# Patient Record
Sex: Female | Born: 1960 | Race: White | Hispanic: No | Marital: Single
Health system: Southern US, Community
[De-identification: ages and names within clinical notes are randomized; demographics above are authoritative.]

---

## 2003-09-28 ENCOUNTER — Ambulatory Visit (HOSPITAL_COMMUNITY): Admission: RE | Admit: 2003-09-28 | Discharge: 2003-09-28 | Payer: Self-pay | Admitting: Family Medicine

## 2005-02-09 ENCOUNTER — Other Ambulatory Visit: Admission: RE | Admit: 2005-02-09 | Discharge: 2005-02-09 | Payer: Self-pay | Admitting: Family Medicine

## 2006-06-10 ENCOUNTER — Ambulatory Visit: Payer: Self-pay | Admitting: Pulmonary Disease

## 2006-07-20 ENCOUNTER — Ambulatory Visit (HOSPITAL_BASED_OUTPATIENT_CLINIC_OR_DEPARTMENT_OTHER): Admission: RE | Admit: 2006-07-20 | Discharge: 2006-07-20 | Payer: Self-pay | Admitting: Pulmonary Disease

## 2006-07-25 ENCOUNTER — Ambulatory Visit: Payer: Self-pay | Admitting: Pulmonary Disease

## 2006-08-09 ENCOUNTER — Ambulatory Visit: Payer: Self-pay | Admitting: Pulmonary Disease

## 2007-08-22 ENCOUNTER — Ambulatory Visit: Payer: Self-pay | Admitting: Pulmonary Disease

## 2011-02-02 ENCOUNTER — Other Ambulatory Visit (HOSPITAL_COMMUNITY)
Admission: RE | Admit: 2011-02-02 | Discharge: 2011-02-02 | Disposition: A | Discharge status: Home / Self Care | Payer: BC Managed Care – PPO | Source: Ambulatory Visit | Attending: Family Medicine | Admitting: Family Medicine

## 2011-02-02 ENCOUNTER — Other Ambulatory Visit: Payer: Self-pay | Admitting: Family Medicine

## 2011-02-02 DIAGNOSIS — Z0000 Encounter for general adult medical examination without abnormal findings: Secondary | ICD-10-CM | POA: Insufficient documentation

## 2011-04-03 NOTE — Procedures (Signed)
NAME:  Melissa Luna, Melissa Luna                 ACCOUNT NO.:  [id number]   MEDICAL RECORD NO.:  [id number]          PATIENT TYPE:  OUT   LOCATION:  SLEEP CENTER                 FACILITY:  [facility]   PHYSICIAN:  Barbaraann Share, MD,FCCPDATE OF BIRTH:  1960/10/07   DATE OF STUDY:  07/20/2006                              NOCTURNAL POLYSOMNOGRAM   REFERRING PHYSICIAN:  Dr. Marcelyn Bruins.   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   EPWORTH SCORE:  20   SLEEP ARCHITECTURE:  The patient had a total sleep time of 425 minutes with  decreased slow wave sleep but adequate REM.  Sleep onset latency was normal  as was REM onset.  Sleep efficiency was fairly good at 96%.   RESPIRATORY DATA:  The patient was found to have 3 hypopneas and 14 apneas  for a Respiratory Disturbance Index of 2.4 events per hour.  The events were  not positional but they occurred almost exclusively during REM.  There was  mild snoring noted throughout.   OXYGEN DATA:  There was O2 desaturation as low as 92% with the patient's  obstructive events.   CARDIAC DATA:  No clinically significant cardiac arrhythmia.   MOVEMENT/PARASOMNIA:  The patient was found to have 34 leg jerks with less  than one per hour resulting in arousal or awakening.   IMPRESSION/RECOMMENDATION:  1. Small numbers of obstructive events which do not meet the Respiratory      Disturbance Index criteria for the obstructive sleep apnea syndrome.      The patient did however have very large numbers of nonspecific arousals      as well as her events occurring almost exclusively during rapid eye      movement.  This raises a question regarding the upper airway resistant      syndrome.  2. Small numbers of leg jerks with very little sleep disruption.          ______________________________  Barbaraann Share, MD,FCCP  Diplomate, American Board of Sleep  Medicine     [facility]  D:  07/22/2006 14:22:50  T:  07/22/2006  19:45:42  Job:  [id number]

## 2011-04-03 NOTE — Assessment & Plan Note (Signed)
[facility]                               PULMONARY OFFICE NOTE   Melissa Luna, Melissa Luna                          MRN:          [id number]  DATE:06/10/2006                            DOB:          [birth year]    SLEEP MEDICINE CONSULTATION:   HISTORY OF PRESENT ILLNESS:  The patient is a very pleasant 50 year old  white female whom I have been asked to see for sleep maintenance issues.  The patient states that she has always been a light sleeper, but now she has  gotten to the point where she will awaken one to three times a night for  unknown reasons.  She typically gets to bed about 10 p.m. and gets up to  start her day at 5 a.m.  She has no difficulty falling asleep within  minutes, however usually awakens around 2 to 3 a.m.  She either cannot get  back to sleep or she dozes off and on so that her sleep is significantly  disrupted.  Usually she will fall asleep within 20-30 minutes but she will  awaken at least once or maybe two more times during the night.  Even on  nights that she gets back to sleep fairly quickly, she never feels like she  is in a deep sleep.  The patient has had occasional snoring but no one has  ever commented on pauses in her breathing during sleep.  She denies any  symptoms of restless legs syndrome, nor has anyone ever commented on a lot  of kicking during the night.  The patient does not have a sense of  frustration about sleeping; however, she is worried about her daytime  sleepiness.  She has significant issues in the late afternoon and evening  while at work and has even had some difficulties with driving.  She has lots  of fatigue during the day and has noticed decreased memory and  concentration.  She will often use an energy drink in the afternoons which  is very heavy in caffeine to help get her through her day and early evening.  The patient states that she is a morning person and usually has no  difficulties with  getting up and getting going first thing in the morning.  The problems usually develop later.  The patient denies any gastroesophageal  reflux symptoms.  In terms of caffeine intake, she has one cup of coffee in  the morning and then her energy drink as stated above in the midafternoon  only for the last 2-3 weeks.  Apparently she has had thyroid function tests  by Dr. Henderson Cloud which were within normal limits.   PAST MEDICAL HISTORY:  Totally unremarkable.  The patient takes testosterone  cream and other vitamins.  She has no known drug allergies.   SOCIAL HISTORY:  She is single and she has never smoked.   FAMILY HISTORY:  Significant for heart disease, otherwise noncontributory.   REVIEW OF SYSTEMS:  As per history of present illness.  Also see patient  intake form documented on the chart.   PHYSICAL EXAMINATION:  GENERAL:  She is a well-developed white female in no  acute distress.  VITAL SIGNS:  Blood pressure is 110/74, pulse is 64, temperature is 98.6.  weight is 140 pounds.  O2 saturation on room air is 99%.  HEENT:  Pupils equal, round and reactive to light and accommodation.  Extraocular muscles are intact.  Nares are somewhat narrowed but patent.  Oropharynx is fairly clear with no significant elongation of the soft palate  or the uvula.  NECK:  Supple without JVD or lymphadenopathy.  There is no palpable  thyromegaly.  CHEST:  Totally clear.  CARDIAC:  Regular rate and rhythm, no murmurs, rubs or gallops.  ABDOMEN:  Soft, nontender, with good bowel sounds.  GENITAL, RECTAL, BREASTS:  Not done and not indicated.  EXTREMITIES:  Lower extremities are without edema.  Good pulses distally.  No calf tenderness.  NEUROLOGIC:  Alert and oriented with no obvious verbal or motor defects.   IMPRESSION:  Sleep maintenance issues with frequent awakening during the  night and significant inappropriate daytime sleepiness with periods of  inactivity.  History really does not suggest  insomnia, especially since she  is usually back to sleep very easily within 20-30 minutes.  I really think  there is something disrupting her sleep that is not obvious from the history  and physical exam.  It was unclear whether she could have very  mild sleep-  disordered breathing, possibly periodic leg movements which may be  disrupting sleep, or possibly occult gastroesophageal reflux disease.  I  really do not think pursuing a course of treatment for insomnia is the right  answer here, although I do not know this for certainty.  I think that she  would benefit from nocturnal polysomnography to see if there is some  identifiable cause for her awakenings at night.  The patient is agreeable to  this approach.   PLAN:  1.  Schedule for nocturnal polysomnogram.  2.  Follow up after the above.                                   Barbaraann Share, MD, FCCP   [facility]  DD:  06/10/2006  DT:  06/10/2006  Job #:  [id number]   cc:   Guy Sandifer. Henderson Cloud, MD

## 2013-06-02 ENCOUNTER — Ambulatory Visit
Admission: RE | Admit: 2013-06-02 | Discharge: 2013-06-02 | Disposition: A | Discharge status: Home / Self Care | Payer: BC Managed Care – PPO | Source: Ambulatory Visit | Attending: Family Medicine | Admitting: Family Medicine

## 2013-06-02 ENCOUNTER — Other Ambulatory Visit: Payer: Self-pay | Admitting: Family Medicine

## 2013-06-02 DIAGNOSIS — M79672 Pain in left foot: Secondary | ICD-10-CM

## 2013-06-02 DIAGNOSIS — M79642 Pain in left hand: Secondary | ICD-10-CM

## 2013-06-02 DIAGNOSIS — M79671 Pain in right foot: Secondary | ICD-10-CM

## 2013-12-15 ENCOUNTER — Other Ambulatory Visit: Payer: Self-pay

## 2013-12-15 DIAGNOSIS — Z1231 Encounter for screening mammogram for malignant neoplasm of breast: Secondary | ICD-10-CM

## 2014-01-08 ENCOUNTER — Ambulatory Visit
Admission: RE | Admit: 2014-01-08 | Discharge: 2014-01-08 | Disposition: A | Discharge status: Home / Self Care | Payer: BC Managed Care – PPO | Source: Ambulatory Visit

## 2014-01-08 DIAGNOSIS — Z1231 Encounter for screening mammogram for malignant neoplasm of breast: Secondary | ICD-10-CM

## 2015-02-15 IMAGING — CR DG FOOT 2V*L*
2 series · 2 of 2 positions shown · non-contrast
Comparison: None

CLINICAL DATA: Bilateral foot pain

LEFT FOOT - 2 VIEW

[view not recorded (1 of 2)]
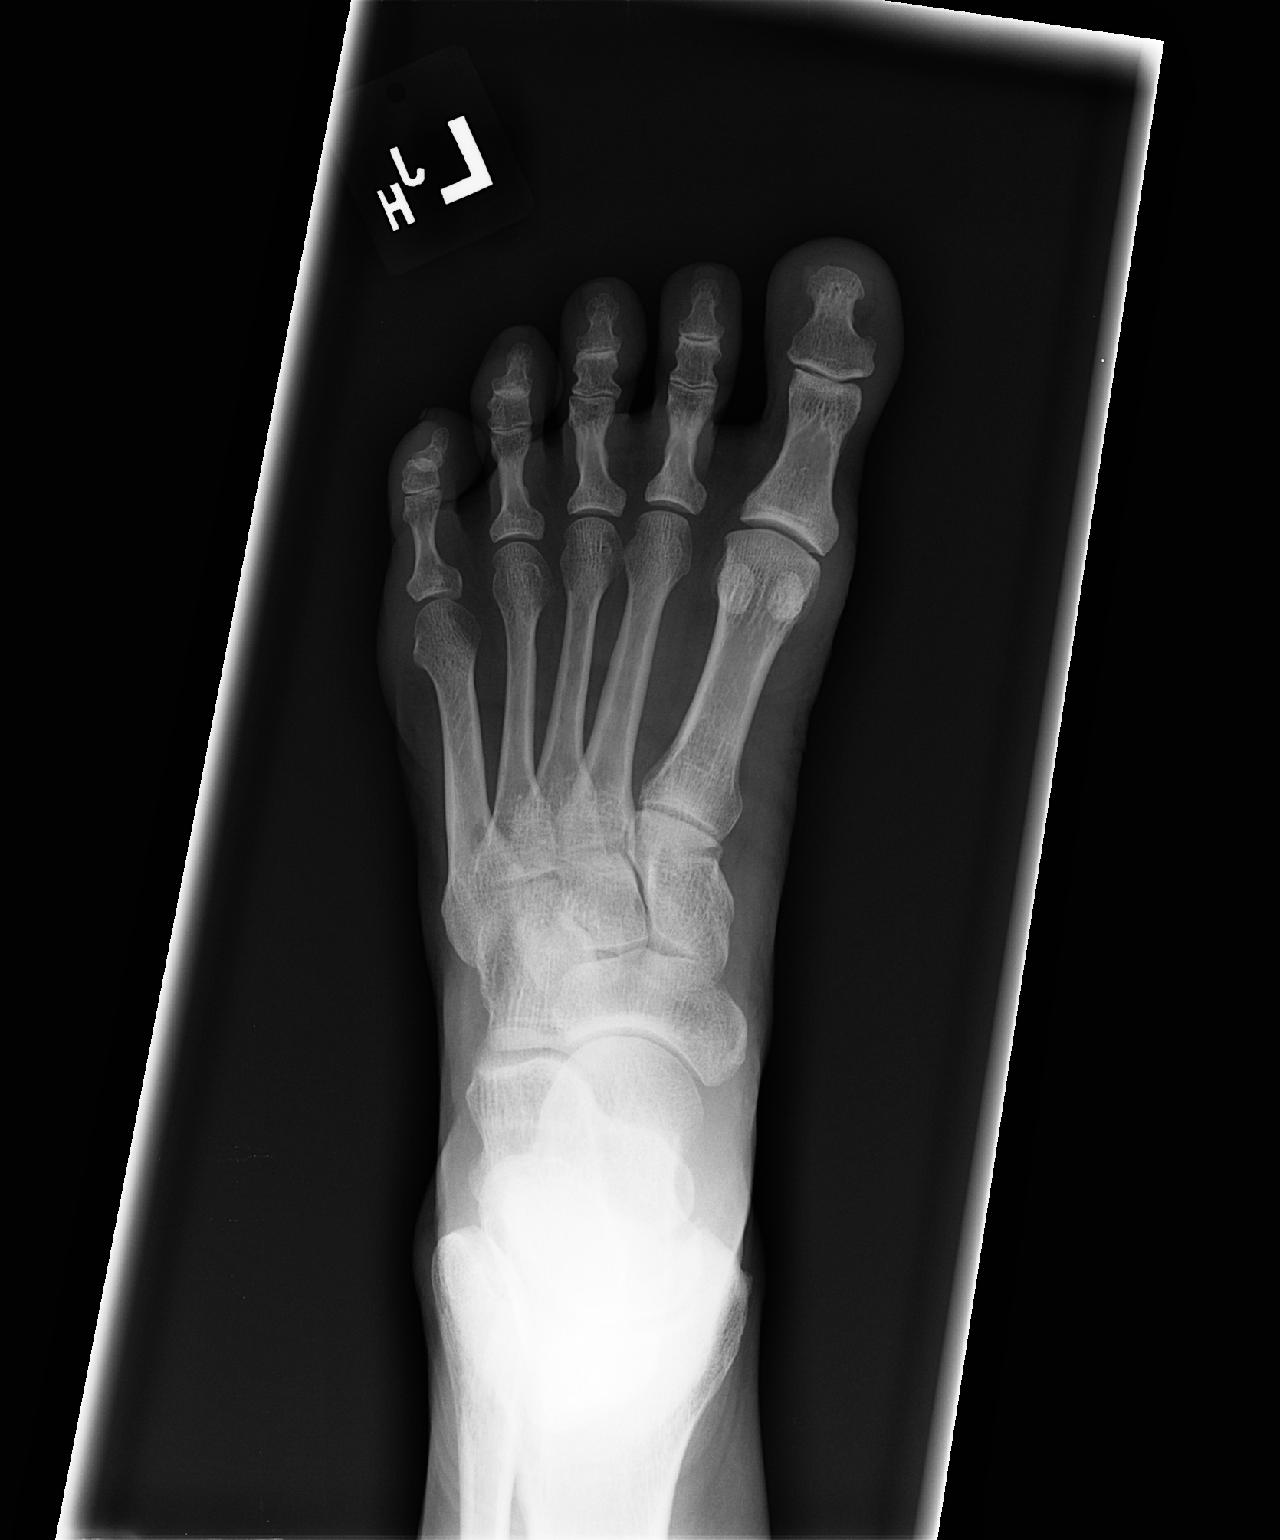

[view not recorded (2 of 2)]
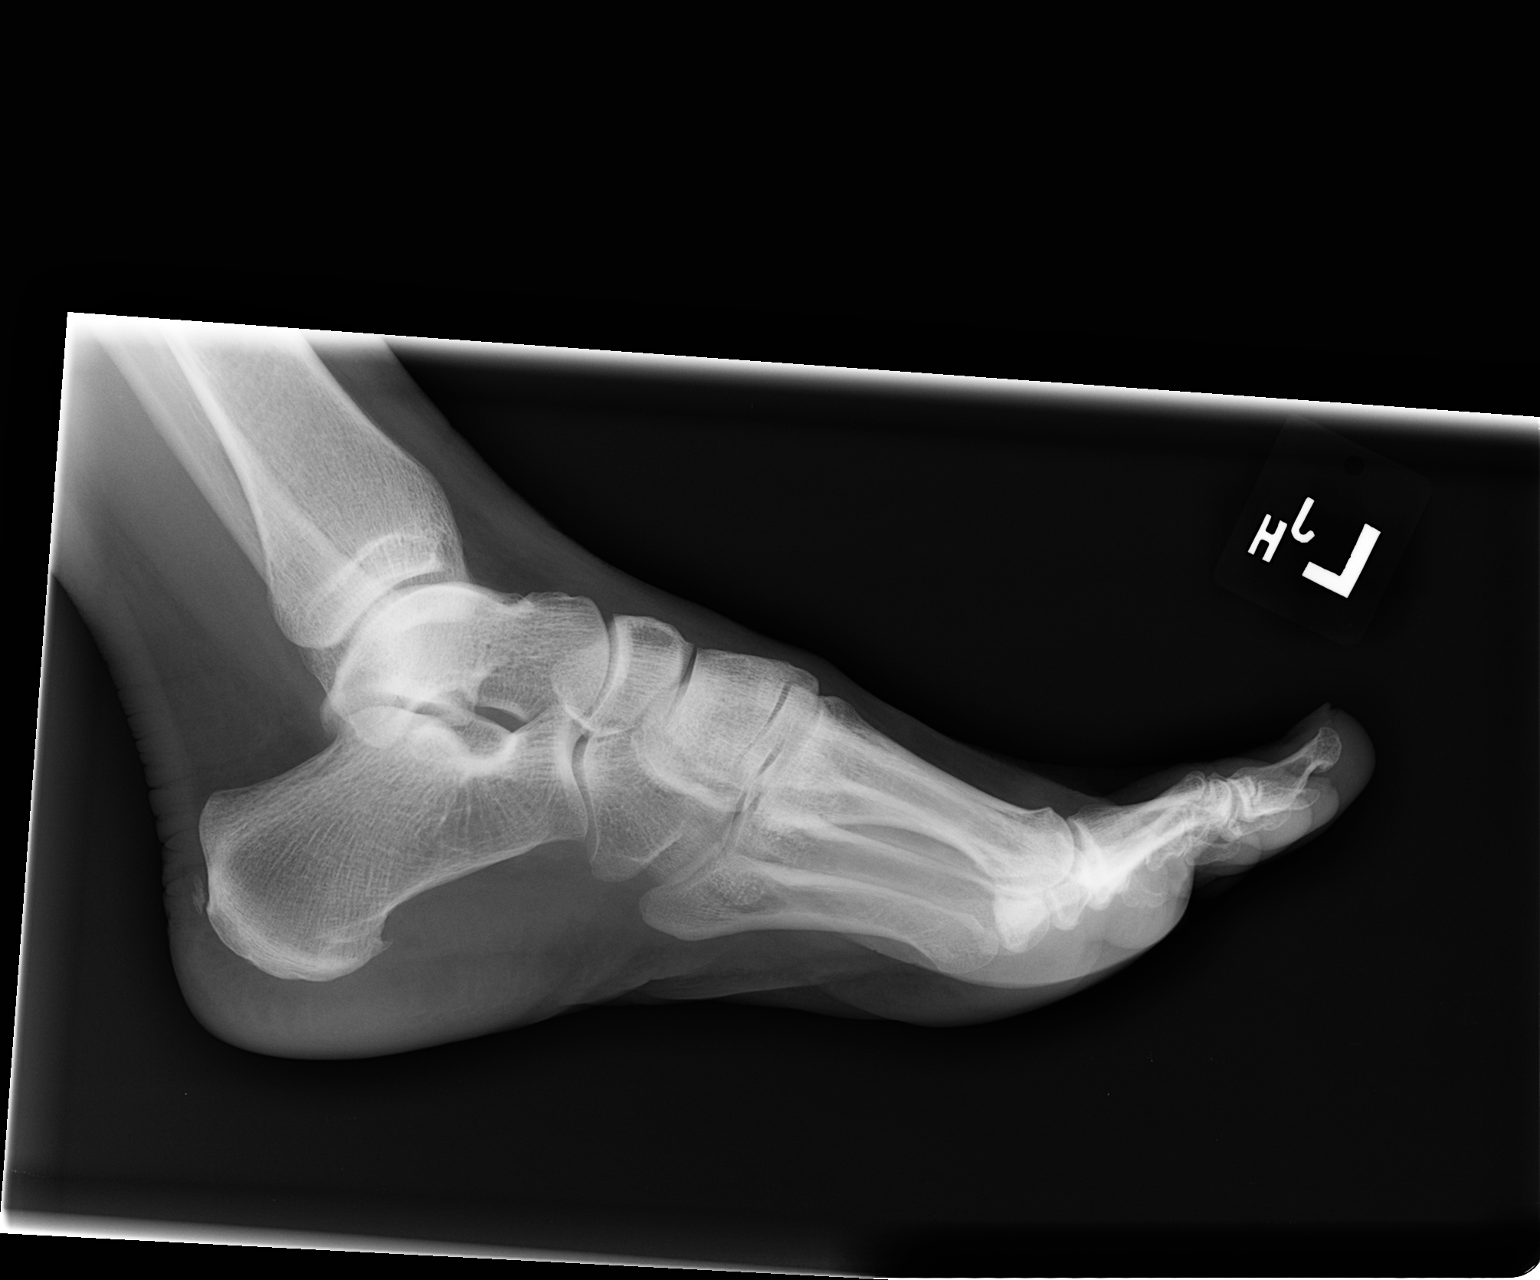

[2 of 2 positions shown; findings below may reference images not displayed]

FINDINGS: Minimal degenerative change in the first MTP.  No other
arthropathy.  No erosion is identified.  Negative for fracture.
IMPRESSION: Minimal degenerative change in the first MTP.  Otherwise negative
